# Patient Record
Sex: Male | Born: 1940 | Race: White | Hispanic: No | Marital: Married
Health system: Southern US, Community
[De-identification: ages and names within clinical notes are randomized; demographics above are authoritative.]

## PROBLEM LIST (undated history)

## (undated) DIAGNOSIS — J9621 Acute and chronic respiratory failure with hypoxia: Secondary | ICD-10-CM

## (undated) DIAGNOSIS — J1282 Pneumonia due to coronavirus disease 2019: Secondary | ICD-10-CM

## (undated) DIAGNOSIS — J95851 Ventilator associated pneumonia: Secondary | ICD-10-CM

## (undated) DIAGNOSIS — U071 COVID-19: Secondary | ICD-10-CM

## (undated) DIAGNOSIS — G9340 Encephalopathy, unspecified: Secondary | ICD-10-CM

---

## 2019-09-13 ENCOUNTER — Inpatient Hospital Stay
Admission: RE | Admit: 2019-09-13 | Discharge: 2019-10-05 | Disposition: A | Discharge status: Rehabilitation Facility | Payer: Medicare Other | Source: Other Acute Inpatient Hospital | Attending: Internal Medicine | Admitting: Internal Medicine

## 2019-09-13 DIAGNOSIS — J9690 Respiratory failure, unspecified, unspecified whether with hypoxia or hypercapnia: Secondary | ICD-10-CM

## 2019-09-13 DIAGNOSIS — J1282 Pneumonia due to coronavirus disease 2019: Secondary | ICD-10-CM | POA: Diagnosis present

## 2019-09-13 DIAGNOSIS — R0602 Shortness of breath: Secondary | ICD-10-CM

## 2019-09-13 DIAGNOSIS — J9621 Acute and chronic respiratory failure with hypoxia: Secondary | ICD-10-CM | POA: Diagnosis present

## 2019-09-13 DIAGNOSIS — U071 COVID-19: Secondary | ICD-10-CM | POA: Diagnosis present

## 2019-09-13 DIAGNOSIS — J95851 Ventilator associated pneumonia: Secondary | ICD-10-CM | POA: Diagnosis present

## 2019-09-13 DIAGNOSIS — G9340 Encephalopathy, unspecified: Secondary | ICD-10-CM | POA: Diagnosis present

## 2019-09-13 HISTORY — DX: Ventilator associated pneumonia: J95.851

## 2019-09-13 HISTORY — DX: Encephalopathy, unspecified: G93.40

## 2019-09-13 HISTORY — DX: Pneumonia due to coronavirus disease 2019: J12.82

## 2019-09-13 HISTORY — DX: Acute and chronic respiratory failure with hypoxia: J96.21

## 2019-09-13 HISTORY — DX: COVID-19: U07.1

## 2019-09-14 ENCOUNTER — Other Ambulatory Visit (HOSPITAL_COMMUNITY): Payer: Medicare Other

## 2019-09-14 DIAGNOSIS — J9621 Acute and chronic respiratory failure with hypoxia: Secondary | ICD-10-CM

## 2019-09-14 DIAGNOSIS — G9340 Encephalopathy, unspecified: Secondary | ICD-10-CM | POA: Diagnosis not present

## 2019-09-14 DIAGNOSIS — U071 COVID-19: Secondary | ICD-10-CM

## 2019-09-14 DIAGNOSIS — J95851 Ventilator associated pneumonia: Secondary | ICD-10-CM

## 2019-09-14 DIAGNOSIS — J1289 Other viral pneumonia: Secondary | ICD-10-CM

## 2019-09-14 LAB — COMPREHENSIVE METABOLIC PANEL
ALT: 85 U/L — ABNORMAL HIGH (ref 0–44)
AST: 38 U/L (ref 15–41)
Albumin: 3.1 g/dL — ABNORMAL LOW (ref 3.5–5.0)
Alkaline Phosphatase: 62 U/L (ref 38–126)
Anion gap: 13 (ref 5–15)
BUN: 19 mg/dL (ref 8–23)
CO2: 24 mmol/L (ref 22–32)
Calcium: 8.5 mg/dL — ABNORMAL LOW (ref 8.9–10.3)
Chloride: 106 mmol/L (ref 98–111)
Creatinine, Ser: 0.76 mg/dL (ref 0.61–1.24)
GFR calc Af Amer: >60 mL/min (ref >60)
GFR calc non Af Amer: >60 mL/min (ref >60)
Glucose, Bld: 94 mg/dL (ref 70–99)
Potassium: 3.9 mmol/L (ref 3.5–5.1)
Sodium: 143 mmol/L (ref 135–145)
Total Bilirubin: 0.9 mg/dL (ref 0.3–1.2)
Total Protein: 5.9 g/dL — ABNORMAL LOW (ref 6.5–8.1)

## 2019-09-14 LAB — CBC WITH DIFFERENTIAL/PLATELET
Abs Immature Granulocytes: 0.1 10*3/uL — ABNORMAL HIGH (ref 0.00–0.07)
Basophils Absolute: 0 10*3/uL (ref 0.0–0.1)
Basophils Relative: 0 %
Eosinophils Absolute: 0 10*3/uL (ref 0.0–0.5)
Eosinophils Relative: 1 %
HCT: 36.6 % — ABNORMAL LOW (ref 39.0–52.0)
Hemoglobin: 11.1 g/dL — ABNORMAL LOW (ref 13.0–17.0)
Immature Granulocytes: 1 %
Lymphocytes Relative: 14 %
Lymphs Abs: 1.2 10*3/uL (ref 0.7–4.0)
MCH: 29.8 pg (ref 26.0–34.0)
MCHC: 30.3 g/dL (ref 30.0–36.0)
MCV: 98.1 fL (ref 80.0–100.0)
Monocytes Absolute: 0.8 10*3/uL (ref 0.1–1.0)
Monocytes Relative: 9 %
Neutro Abs: 6.6 10*3/uL (ref 1.7–7.7)
Neutrophils Relative %: 75 %
Platelets: 233 10*3/uL (ref 150–400)
RBC: 3.73 MIL/uL — ABNORMAL LOW (ref 4.22–5.81)
RDW: 19.5 % — ABNORMAL HIGH (ref 11.5–15.5)
WBC: 8.7 10*3/uL (ref 4.0–10.5)
nRBC: 0 % (ref 0.0–0.2)

## 2019-09-14 LAB — PROTIME-INR
INR: 1 (ref 0.8–1.2)
Prothrombin Time: 13.2 seconds (ref 11.4–15.2)

## 2019-09-14 NOTE — Consult Note (Signed)
Pulmonary [place]  PULMONARY SERVICE  Date of Service: 09/14/2019  PULMONARY CRITICAL CARE CONSULT   NOLAND PIZANO  SPQ:[id number]  DOB: 1941/04/29   DOA: 09/13/2019  Referring Physician: Merton Border, MD  HPI: Jeremy Scott is a 78 y.o. male seen for follow up of Acute on Chronic Respiratory Failure.  Patient has multiple medical problems including hypertension hyperlipidemia myocardial infarction coronary artery disease GI bleed GERD sleep apnea presented to the hospital because of increasing shortness of breath and cough.  Patient was tested for COVID-19 and was found to be positive.  Patient had decompensation with increased oxygen requirements requiring 15 L nonrebreather and then was worsened eventually ending up intubated on the ventilator.  Treatment included convalescent plasma as well as new remdesivir.  Subsequent to the intubation patient was attempted at weaning did not tolerate weaning patient ended up having to have a tracheostomy done.  Patient now presents to our facility for further management and weaning.  Review of Systems:  ROS performed and is unremarkable other than noted above.  Past medical history: Hypertension Hyperlipidemia Coronary artery disease GI bleed GERD Sleep apnea Dermatitis  Past surgical history: CABG Colonoscopy Coronary stenting EGD Hernia repair  Social history: No current alcohol or drug abuse Former smoker  Family history: Positive for cancer diabetes   Medications: Reviewed on Rounds  Physical Exam:  Vitals: Temperature 98.4 pulse 88 respiratory 24 blood pressure 128/65 saturations 97%  Ventilator Settings off the ventilator on the nag device  . General: Comfortable at this time . Eyes: Grossly normal lids, irises & conjunctiva . ENT: grossly tongue is normal . Neck: no obvious mass . Cardiovascular: S1-S2 normal no gallop or rub . Respiratory: No rhonchi no rales are  noted . Abdomen: Soft nontender . Skin: no rash seen on limited exam . Musculoskeletal: not rigid . Psychiatric:unable to assess . Neurologic: no seizure no involuntary movements         Labs on Admission:  Basic Metabolic Panel: Recent Labs  Lab 09/14/19 0442  NA 143  K 3.9  CL 106  CO2 24  GLUCOSE 94  BUN 19  CREATININE 0.76  CALCIUM 8.5*    No results for input(s): PHART, PCO2ART, PO2ART, HCO3, O2SAT in the last 168 hours.  Liver Function Tests: Recent Labs  Lab 09/14/19 0442  AST 38  ALT 85*  ALKPHOS 62  BILITOT 0.9  PROT 5.9*  ALBUMIN 3.1*   No results for input(s): LIPASE, AMYLASE in the last 168 hours. No results for input(s): AMMONIA in the last 168 hours.  CBC: Recent Labs  Lab 09/14/19 0442  WBC 8.7  NEUTROABS 6.6  HGB 11.1*  HCT 36.6*  MCV 98.1  PLT 233    Cardiac Enzymes: No results for input(s): CKTOTAL, CKMB, CKMBINDEX, TROPONINI in the last 168 hours.  BNP (last 3 results) No results for input(s): BNP in the last 8760 hours.  ProBNP (last 3 results) No results for input(s): PROBNP in the last 8760 hours.   Radiological Exams on Admission: Dg Chest Port 1 View  Result Date: 09/14/2019 CLINICAL DATA:  History of COVID-19. EXAM: PORTABLE CHEST 1 VIEW COMPARISON:  No prior. FINDINGS: Prior median sternotomy. Cardiomegaly. No pulmonary venous congestion. Diffuse bilateral pulmonary interstitial prominence. Pneumonitis could present this fashion. No pleural effusion or pneumothorax. Surgical clips left upper abdomen. No acute bony abnormality. IMPRESSION: 1. Diffuse bilateral pulmonary interstitial prominence. Pneumonitis could present in this fashion. 2. Prior median sternotomy. Cardiomegaly.  No pulmonary venous congestion. Electronically Signed   By: Maisie Fus  Register   On: 09/14/2019 06:59    Assessment/Plan Active Problems:   Acute on chronic respiratory failure with hypoxia (HCC)   Pneumonia due to COVID-19 virus   COVID-19 virus  infection   Ventilator associated pneumonia (HCC)   Acute encephalopathy   1. Acute on chronic respiratory failure with hypoxia we will continue with advancing well and change the trach out to a cuffless trach you begin with capping trials. 2. COVID-19 pneumonia in recovery phase we will continue with supportive care last chest x-ray still showing interstitial prominence 3. COVID-19 virus infection in resolution phase we will continue with supportive care 4. Ventilator associated pneumonia patient was treated and showed some residual interstitial infiltrate 5. Encephalopathy unchanged we will continue to monitor closely.  I have personally seen and evaluated the patient, evaluated laboratory and imaging results, formulated the assessment and plan and placed orders. The Patient requires high complexity decision making for assessment and support.  Case was discussed on Rounds with the Respiratory Therapy Staff Time Spent  Yevonne Pax, MD [facility] Pulmonary Critical Care Medicine Sleep Medicine

## 2019-09-15 DIAGNOSIS — G9340 Encephalopathy, unspecified: Secondary | ICD-10-CM | POA: Diagnosis not present

## 2019-09-15 DIAGNOSIS — J95851 Ventilator associated pneumonia: Secondary | ICD-10-CM | POA: Diagnosis not present

## 2019-09-15 DIAGNOSIS — U071 COVID-19: Secondary | ICD-10-CM | POA: Diagnosis not present

## 2019-09-15 DIAGNOSIS — J9621 Acute and chronic respiratory failure with hypoxia: Secondary | ICD-10-CM | POA: Diagnosis not present

## 2019-09-15 NOTE — Progress Notes (Signed)
Pulmonary Critical Care Medicine [place]   PULMONARY CRITICAL CARE SERVICE  PROGRESS NOTE  Date of Service: 09/15/2019  Jeremy Scott  [id number]  DOB: 04-05-1941   DOA: 09/13/2019  Referring Physician: Merton Border, MD  HPI: Jeremy Scott is a 78 y.o. male seen for follow up of Acute on Chronic Respiratory Failure.  Patient is currently on 4 L capping and doing well  Medications: Reviewed on Rounds  Physical Exam:  Vitals: Temperature 97.8 pulse 72 respiratory 21 blood pressure 118/68 saturations 97%  Ventilator Settings capping off the ventilator  . General: Comfortable at this time . Eyes: Grossly normal lids, irises & conjunctiva . ENT: grossly tongue is normal . Neck: no obvious mass . Cardiovascular: S1 S2 normal no gallop . Respiratory: No rhonchi no rales are noted at this time . Abdomen: soft . Skin: no rash seen on limited exam . Musculoskeletal: not rigid . Psychiatric:unable to assess . Neurologic: no seizure no involuntary movements         Lab Data:   Basic Metabolic Panel: Recent Labs  Lab 09/14/19 0442  NA 143  K 3.9  CL 106  CO2 24  GLUCOSE 94  BUN 19  CREATININE 0.76  CALCIUM 8.5*    ABG: No results for input(s): PHART, PCO2ART, PO2ART, HCO3, O2SAT in the last 168 hours.  Liver Function Tests: Recent Labs  Lab 09/14/19 0442  AST 38  ALT 85*  ALKPHOS 62  BILITOT 0.9  PROT 5.9*  ALBUMIN 3.1*   No results for input(s): LIPASE, AMYLASE in the last 168 hours. No results for input(s): AMMONIA in the last 168 hours.  CBC: Recent Labs  Lab 09/14/19 0442  WBC 8.7  NEUTROABS 6.6  HGB 11.1*  HCT 36.6*  MCV 98.1  PLT 233    Cardiac Enzymes: No results for input(s): CKTOTAL, CKMB, CKMBINDEX, TROPONINI in the last 168 hours.  BNP (last 3 results) No results for input(s): BNP in the last 8760 hours.  ProBNP (last 3 results) No results for input(s): PROBNP in the last 8760 hours.  Radiological  Exams: Dg Chest Port 1 View  Result Date: 09/14/2019 CLINICAL DATA:  History of COVID-19. EXAM: PORTABLE CHEST 1 VIEW COMPARISON:  No prior. FINDINGS: Prior median sternotomy. Cardiomegaly. No pulmonary venous congestion. Diffuse bilateral pulmonary interstitial prominence. Pneumonitis could present this fashion. No pleural effusion or pneumothorax. Surgical clips left upper abdomen. No acute bony abnormality. IMPRESSION: 1. Diffuse bilateral pulmonary interstitial prominence. Pneumonitis could present in this fashion. 2. Prior median sternotomy. Cardiomegaly. No pulmonary venous congestion. Electronically Signed   By: Marcello Moores  Register   On: 09/14/2019 06:59    Assessment/Plan Active Problems:   Acute on chronic respiratory failure with hypoxia (HCC)   Pneumonia due to COVID-19 virus   COVID-19 virus infection   Ventilator associated pneumonia ([place])   Acute encephalopathy   1. Acute on chronic respiratory failure with hypoxia patient is going to continue with capping trials on 4 L oxygen doing well will advance as tolerated 2. Pneumonia due to COVID-19 still with trace we will need to continue to follow closely 3. COVID-19 virus infection in resolution phase 4. Ventilator associated pneumonia treated improved 5. Encephalopathy continues to show improvement   I have personally seen and evaluated the patient, evaluated laboratory and imaging results, formulated the assessment and plan and placed orders. The Patient requires high complexity decision making for assessment and support.  Case was discussed on Rounds with the Respiratory Therapy Staff  Allyne Gee, MD [facility] Pulmonary Critical Care Medicine Sleep Medicine

## 2019-09-16 ENCOUNTER — Encounter: Payer: Self-pay | Admitting: Internal Medicine

## 2019-09-16 DIAGNOSIS — J9621 Acute and chronic respiratory failure with hypoxia: Secondary | ICD-10-CM | POA: Diagnosis not present

## 2019-09-16 DIAGNOSIS — J95851 Ventilator associated pneumonia: Secondary | ICD-10-CM | POA: Diagnosis present

## 2019-09-16 DIAGNOSIS — U071 COVID-19: Secondary | ICD-10-CM | POA: Diagnosis present

## 2019-09-16 DIAGNOSIS — J1282 Pneumonia due to coronavirus disease 2019: Secondary | ICD-10-CM | POA: Diagnosis present

## 2019-09-16 DIAGNOSIS — G9340 Encephalopathy, unspecified: Secondary | ICD-10-CM | POA: Diagnosis not present

## 2019-09-16 NOTE — Progress Notes (Signed)
Pulmonary Critical Care Medicine [place]   PULMONARY CRITICAL CARE SERVICE  PROGRESS NOTE  Date of Service: 09/16/2019  Jeremy Scott  [id number]  DOB: 1941/10/10   DOA: 09/13/2019  Referring Physician: Merton Border, MD  HPI: Jeremy Scott is a 78 y.o. male seen for follow up of Acute on Chronic Respiratory Failure.  Patient has been capping now for 48 hours plan is to decannulate in the morning  Medications: Reviewed on Rounds  Physical Exam:  Vitals: Temperature is 100.7 pulse 89 respiratory 25 blood pressure 110/65 saturations 97%  Ventilator Settings capping right now off the ventilator  . General: Comfortable at this time . Eyes: Grossly normal lids, irises & conjunctiva . ENT: grossly tongue is normal . Neck: no obvious mass . Cardiovascular: S1 S2 normal no gallop . Respiratory: No rhonchi no rales are noted at this time . Abdomen: soft . Skin: no rash seen on limited exam . Musculoskeletal: not rigid . Psychiatric:unable to assess . Neurologic: no seizure no involuntary movements         Lab Data:   Basic Metabolic Panel: Recent Labs  Lab 09/14/19 0442  NA 143  K 3.9  CL 106  CO2 24  GLUCOSE 94  BUN 19  CREATININE 0.76  CALCIUM 8.5*    ABG: No results for input(s): PHART, PCO2ART, PO2ART, HCO3, O2SAT in the last 168 hours.  Liver Function Tests: Recent Labs  Lab 09/14/19 0442  AST 38  ALT 85*  ALKPHOS 62  BILITOT 0.9  PROT 5.9*  ALBUMIN 3.1*   No results for input(s): LIPASE, AMYLASE in the last 168 hours. No results for input(s): AMMONIA in the last 168 hours.  CBC: Recent Labs  Lab 09/14/19 0442  WBC 8.7  NEUTROABS 6.6  HGB 11.1*  HCT 36.6*  MCV 98.1  PLT 233    Cardiac Enzymes: No results for input(s): CKTOTAL, CKMB, CKMBINDEX, TROPONINI in the last 168 hours.  BNP (last 3 results) No results for input(s): BNP in the last 8760 hours.  ProBNP (last 3 results) No results for input(s):  PROBNP in the last 8760 hours.  Radiological Exams: No results found.  Assessment/Plan Active Problems:   Acute on chronic respiratory failure with hypoxia (HCC)   Pneumonia due to COVID-19 virus   COVID-19 virus infection   Ventilator associated pneumonia ([place])   Acute encephalopathy   1. Acute on chronic respiratory failure hypoxia clinically improving completed 48 hours of capping plan is to continue forward to 72 hours 2. Pneumonia due to COVID-19 treated clinically improved 3. COVID-19 virus infection in resolution phase 4. Ventilator associated pneumonia. 5. Encephalopathy clinically improved   I have personally seen and evaluated the patient, evaluated laboratory and imaging results, formulated the assessment and plan and placed orders. The Patient requires high complexity decision making for assessment and support.  Case was discussed on Rounds with the Respiratory Therapy Staff  Allyne Gee, MD [facility] Pulmonary Critical Care Medicine Sleep Medicine

## 2019-09-17 DIAGNOSIS — U071 COVID-19: Secondary | ICD-10-CM | POA: Diagnosis not present

## 2019-09-17 DIAGNOSIS — G9340 Encephalopathy, unspecified: Secondary | ICD-10-CM | POA: Diagnosis not present

## 2019-09-17 DIAGNOSIS — J95851 Ventilator associated pneumonia: Secondary | ICD-10-CM | POA: Diagnosis not present

## 2019-09-17 DIAGNOSIS — J9621 Acute and chronic respiratory failure with hypoxia: Secondary | ICD-10-CM | POA: Diagnosis not present

## 2019-09-17 NOTE — Progress Notes (Addendum)
Pulmonary Critical Care Medicine [place]   PULMONARY CRITICAL CARE SERVICE  PROGRESS NOTE  Date of Service: 09/17/2019  Jeremy Scott  [id number]:[id number]  DOB: 1941/10/14   DOA: 09/13/2019  Referring Physician: Merton Border, MD  HPI: Jeremy Scott is a 78 y.o. male seen for follow up of Acute on Chronic Respiratory Failure.  Patient was decannulated today on 2 L of oxygen by nasal cannula satting well with no distress.  Medications: Reviewed on Rounds  Physical Exam:  Vitals: Pulse 77 respirations 21 BP 100/53 O2 sat 97% temp 98.4  Ventilator Settings 2 L nasal cannula  . General: Comfortable at this time . Eyes: Grossly normal lids, irises & conjunctiva . ENT: grossly tongue is normal . Neck: no obvious mass . Cardiovascular: S1 S2 normal no gallop . Respiratory: No rales or rhonchi noted . Abdomen: soft . Skin: no rash seen on limited exam . Musculoskeletal: not rigid . Psychiatric:unable to assess . Neurologic: no seizure no involuntary movements         Lab Data:   Basic Metabolic Panel: Recent Labs  Lab 09/14/19 0442  NA 143  K 3.9  CL 106  CO2 24  GLUCOSE 94  BUN 19  CREATININE 0.76  CALCIUM 8.5*    ABG: No results for input(s): PHART, PCO2ART, PO2ART, HCO3, O2SAT in the last 168 hours.  Liver Function Tests: Recent Labs  Lab 09/14/19 0442  AST 38  ALT 85*  ALKPHOS 62  BILITOT 0.9  PROT 5.9*  ALBUMIN 3.1*   No results for input(s): LIPASE, AMYLASE in the last 168 hours. No results for input(s): AMMONIA in the last 168 hours.  CBC: Recent Labs  Lab 09/14/19 0442  WBC 8.7  NEUTROABS 6.6  HGB 11.1*  HCT 36.6*  MCV 98.1  PLT 233    Cardiac Enzymes: No results for input(s): CKTOTAL, CKMB, CKMBINDEX, TROPONINI in the last 168 hours.  BNP (last 3 results) No results for input(s): BNP in the last 8760 hours.  ProBNP (last 3 results) No results for input(s): PROBNP in the last 8760 hours.  Radiological  Exams: No results found.  Assessment/Plan Active Problems:   Acute on chronic respiratory failure with hypoxia (HCC)   Pneumonia due to COVID-19 virus   COVID-19 virus infection   Ventilator associated pneumonia ([place])   Acute encephalopathy   1. Acute on chronic respiratory failure hypoxia patient decannulated on 2 L oxygen satting well continue aggressive pulmonary toilet supportive measures. 2. Pneumonia due to COVID-19 treated clinically improved 3. COVID-19 virus infection in resolution phase 4. Ventilator associated pneumonia. 5. Encephalopathy clinically improved   I have personally seen and evaluated the patient, evaluated laboratory and imaging results, formulated the assessment and plan and placed orders. The Patient requires high complexity decision making for assessment and support.  Case was discussed on Rounds with the Respiratory Therapy Staff  Allyne Gee, MD [facility] Pulmonary Critical Care Medicine Sleep Medicine

## 2019-09-18 DIAGNOSIS — U071 COVID-19: Secondary | ICD-10-CM | POA: Diagnosis not present

## 2019-09-18 DIAGNOSIS — J95851 Ventilator associated pneumonia: Secondary | ICD-10-CM | POA: Diagnosis not present

## 2019-09-18 DIAGNOSIS — G9340 Encephalopathy, unspecified: Secondary | ICD-10-CM | POA: Diagnosis not present

## 2019-09-18 DIAGNOSIS — J9621 Acute and chronic respiratory failure with hypoxia: Secondary | ICD-10-CM | POA: Diagnosis not present

## 2019-09-18 NOTE — Progress Notes (Addendum)
Pulmonary Critical Care Medicine [place]   PULMONARY CRITICAL CARE SERVICE  PROGRESS NOTE  Date of Service: 09/18/2019  JAMARIOUS FEBO  [id number]:[id number]  DOB: Mar 16, 1941   DOA: 09/13/2019  Referring Physician: Merton Border, MD  HPI: Jeremy Scott is a 78 y.o. male seen for follow up of Acute on Chronic Respiratory Failure.  Patient remains decannulated on 2 L of oxygen via cannula or BiPAP overnight satting well no distress.  Medications: Reviewed on Rounds  Physical Exam:  Vitals: Pulse 73 respirations 24 BP 97/62 O2 sat 95% temp 97.1  Ventilator Settings 2 L nasal cannula  . General: Comfortable at this time . Eyes: Grossly normal lids, irises & conjunctiva . ENT: grossly tongue is normal . Neck: no obvious mass . Cardiovascular: S1 S2 normal no gallop . Respiratory: No rales or rhonchi noted . Abdomen: soft . Skin: no rash seen on limited exam . Musculoskeletal: not rigid . Psychiatric:unable to assess . Neurologic: no seizure no involuntary movements         Lab Data:   Basic Metabolic Panel: Recent Labs  Lab 09/14/19 0442  NA 143  K 3.9  CL 106  CO2 24  GLUCOSE 94  BUN 19  CREATININE 0.76  CALCIUM 8.5*    ABG: No results for input(s): PHART, PCO2ART, PO2ART, HCO3, O2SAT in the last 168 hours.  Liver Function Tests: Recent Labs  Lab 09/14/19 0442  AST 38  ALT 85*  ALKPHOS 62  BILITOT 0.9  PROT 5.9*  ALBUMIN 3.1*   No results for input(s): LIPASE, AMYLASE in the last 168 hours. No results for input(s): AMMONIA in the last 168 hours.  CBC: Recent Labs  Lab 09/14/19 0442  WBC 8.7  NEUTROABS 6.6  HGB 11.1*  HCT 36.6*  MCV 98.1  PLT 233    Cardiac Enzymes: No results for input(s): CKTOTAL, CKMB, CKMBINDEX, TROPONINI in the last 168 hours.  BNP (last 3 results) No results for input(s): BNP in the last 8760 hours.  ProBNP (last 3 results) No results for input(s): PROBNP in the last 8760 hours.  Radiological  Exams: No results found.  Assessment/Plan Active Problems:   Acute on chronic respiratory failure with hypoxia (HCC)   Pneumonia due to COVID-19 virus   COVID-19 virus infection   Ventilator associated pneumonia ([place])   Acute encephalopathy   1. Acute on chronic respiratory failure hypoxia patient continues to do well decannulated were BiPAP overnight and is on 2 L of oxygen nasal cannula during the day.  Continue supportive measures and pulmonary toilet. 2. Pneumonia due to COVID-19 treated clinically improved 3. COVID-19 virus infection in resolution phase 4. Ventilator associated pneumonia. 5. Encephalopathy clinically improved   I have personally seen and evaluated the patient, evaluated laboratory and imaging results, formulated the assessment and plan and placed orders. The Patient requires high complexity decision making for assessment and support.  Case was discussed on Rounds with the Respiratory Therapy Staff  Allyne Gee, MD [facility] Pulmonary Critical Care Medicine Sleep Medicine

## 2019-09-19 DIAGNOSIS — G9340 Encephalopathy, unspecified: Secondary | ICD-10-CM | POA: Diagnosis not present

## 2019-09-19 DIAGNOSIS — U071 COVID-19: Secondary | ICD-10-CM | POA: Diagnosis not present

## 2019-09-19 DIAGNOSIS — J9621 Acute and chronic respiratory failure with hypoxia: Secondary | ICD-10-CM | POA: Diagnosis not present

## 2019-09-19 DIAGNOSIS — J95851 Ventilator associated pneumonia: Secondary | ICD-10-CM | POA: Diagnosis not present

## 2019-09-19 LAB — BASIC METABOLIC PANEL
Anion gap: 10 (ref 5–15)
BUN: 34 mg/dL — ABNORMAL HIGH (ref 8–23)
CO2: 21 mmol/L — ABNORMAL LOW (ref 22–32)
Calcium: 8.1 mg/dL — ABNORMAL LOW (ref 8.9–10.3)
Chloride: 109 mmol/L (ref 98–111)
Creatinine, Ser: 0.85 mg/dL (ref 0.61–1.24)
GFR calc Af Amer: >60 mL/min (ref >60)
GFR calc non Af Amer: >60 mL/min (ref >60)
Glucose, Bld: 102 mg/dL — ABNORMAL HIGH (ref 70–99)
Potassium: 3.8 mmol/L (ref 3.5–5.1)
Sodium: 140 mmol/L (ref 135–145)

## 2019-09-19 LAB — CBC
HCT: 32.9 % — ABNORMAL LOW (ref 39.0–52.0)
Hemoglobin: 10 g/dL — ABNORMAL LOW (ref 13.0–17.0)
MCH: 29.3 pg (ref 26.0–34.0)
MCHC: 30.4 g/dL (ref 30.0–36.0)
MCV: 96.5 fL (ref 80.0–100.0)
Platelets: 133 10*3/uL — ABNORMAL LOW (ref 150–400)
RBC: 3.41 MIL/uL — ABNORMAL LOW (ref 4.22–5.81)
RDW: 19.4 % — ABNORMAL HIGH (ref 11.5–15.5)
WBC: 7.5 10*3/uL (ref 4.0–10.5)
nRBC: 0 % (ref 0.0–0.2)

## 2019-09-19 LAB — MAGNESIUM: Magnesium: 2 mg/dL (ref 1.7–2.4)

## 2019-09-19 NOTE — Progress Notes (Addendum)
Pulmonary Critical Care Medicine [place]   PULMONARY CRITICAL CARE SERVICE  PROGRESS NOTE  Date of Service: 09/19/2019  Jeremy Scott  [id number]  DOB: April 19, 1941   DOA: 09/13/2019  Referring Physician: Merton Border, MD  HPI: Jeremy Scott is a 78 y.o. male seen for follow up of Acute on Chronic Respiratory Failure.  Patient remains decannulated on 2 L of oxygen.  Satting well no fever distress.  Medications: Reviewed on Rounds  Physical Exam:  Vitals: Pulse 64 respirations 22 BP 106/57 O2 sat 97% temp 97.4  Ventilator Settings 2 L nasal cannula  . General: Comfortable at this time . Eyes: Grossly normal lids, irises & conjunctiva . ENT: grossly tongue is normal . Neck: no obvious mass . Cardiovascular: S1 S2 normal no gallop . Respiratory: No rales or rhonchi noted . Abdomen: soft . Skin: no rash seen on limited exam . Musculoskeletal: not rigid . Psychiatric:unable to assess . Neurologic: no seizure no involuntary movements         Lab Data:   Basic Metabolic Panel: Recent Labs  Lab 09/14/19 0442 09/19/19 0449  NA 143 140  K 3.9 3.8  CL 106 109  CO2 24 21*  GLUCOSE 94 102*  BUN 19 34*  CREATININE 0.76 0.85  CALCIUM 8.5* 8.1*  MG  --  2.0    ABG: No results for input(s): PHART, PCO2ART, PO2ART, HCO3, O2SAT in the last 168 hours.  Liver Function Tests: Recent Labs  Lab 09/14/19 0442  AST 38  ALT 85*  ALKPHOS 62  BILITOT 0.9  PROT 5.9*  ALBUMIN 3.1*   No results for input(s): LIPASE, AMYLASE in the last 168 hours. No results for input(s): AMMONIA in the last 168 hours.  CBC: Recent Labs  Lab 09/14/19 0442 09/19/19 0449  WBC 8.7 7.5  NEUTROABS 6.6  --   HGB 11.1* 10.0*  HCT 36.6* 32.9*  MCV 98.1 96.5  PLT 233 133*    Cardiac Enzymes: No results for input(s): CKTOTAL, CKMB, CKMBINDEX, TROPONINI in the last 168 hours.  BNP (last 3 results) No results for input(s): BNP in the last 8760 hours.  ProBNP  (last 3 results) No results for input(s): PROBNP in the last 8760 hours.  Radiological Exams: No results found.  Assessment/Plan Active Problems:   Acute on chronic respiratory failure with hypoxia (HCC)   Pneumonia due to COVID-19 virus   COVID-19 virus infection   Ventilator associated pneumonia ([place])   Acute encephalopathy    1. Acute on chronic respiratory failure hypoxia  patient is doing well decannulation.  Can use BiPAP at night as needed.  We will continue 2 L cannula. 2. Pneumonia due to COVID-19 treated clinically improved 3. COVID-19 virus infection in resolution phase 4. Ventilator associated pneumonia. 5. Encephalopathy clinically improved   I have personally seen and evaluated the patient, evaluated laboratory and imaging results, formulated the assessment and plan and placed orders. The Patient requires high complexity decision making for assessment and support.  Case was discussed on Rounds with the Respiratory Therapy Staff  Allyne Gee, MD [facility] Pulmonary Critical Care Medicine Sleep Medicine

## 2019-09-20 DIAGNOSIS — G9340 Encephalopathy, unspecified: Secondary | ICD-10-CM | POA: Diagnosis not present

## 2019-09-20 DIAGNOSIS — U071 COVID-19: Secondary | ICD-10-CM | POA: Diagnosis not present

## 2019-09-20 DIAGNOSIS — J9621 Acute and chronic respiratory failure with hypoxia: Secondary | ICD-10-CM | POA: Diagnosis not present

## 2019-09-20 DIAGNOSIS — J95851 Ventilator associated pneumonia: Secondary | ICD-10-CM | POA: Diagnosis not present

## 2019-09-20 NOTE — Progress Notes (Addendum)
Pulmonary Critical Care Medicine [place]   PULMONARY CRITICAL CARE SERVICE  PROGRESS NOTE  Date of Service: 09/20/2019  PHILOPATEER STRINE  [id number]:[id number]  DOB: 24-Oct-1941   DOA: 09/13/2019  Referring Physician: Merton Border, MD  HPI: Jeremy Scott is a 78 y.o. male seen for follow up of Acute on Chronic Respiratory Failure.  Patient continues on 2 L oxygen via nasal cannula and uses BiPAP as needed.  Satting well no distress.  Medications: Reviewed on Rounds  Physical Exam:  Vitals: Pulse 69 respirations 24 BP 112/63 O2 sat 98% temp 98.3  Ventilator Settings 2 L nasal cannula  . General: Comfortable at this time . Eyes: Grossly normal lids, irises & conjunctiva . ENT: grossly tongue is normal . Neck: no obvious mass . Cardiovascular: S1 S2 normal no gallop . Respiratory: No rales or rhonchi noted . Abdomen: soft . Skin: no rash seen on limited exam . Musculoskeletal: not rigid . Psychiatric:unable to assess . Neurologic: no seizure no involuntary movements         Lab Data:   Basic Metabolic Panel: Recent Labs  Lab 09/14/19 0442 09/19/19 0449  NA 143 140  K 3.9 3.8  CL 106 109  CO2 24 21*  GLUCOSE 94 102*  BUN 19 34*  CREATININE 0.76 0.85  CALCIUM 8.5* 8.1*  MG  --  2.0    ABG: No results for input(s): PHART, PCO2ART, PO2ART, HCO3, O2SAT in the last 168 hours.  Liver Function Tests: Recent Labs  Lab 09/14/19 0442  AST 38  ALT 85*  ALKPHOS 62  BILITOT 0.9  PROT 5.9*  ALBUMIN 3.1*   No results for input(s): LIPASE, AMYLASE in the last 168 hours. No results for input(s): AMMONIA in the last 168 hours.  CBC: Recent Labs  Lab 09/14/19 0442 09/19/19 0449  WBC 8.7 7.5  NEUTROABS 6.6  --   HGB 11.1* 10.0*  HCT 36.6* 32.9*  MCV 98.1 96.5  PLT 233 133*    Cardiac Enzymes: No results for input(s): CKTOTAL, CKMB, CKMBINDEX, TROPONINI in the last 168 hours.  BNP (last 3 results) No results for input(s): BNP in the last  8760 hours.  ProBNP (last 3 results) No results for input(s): PROBNP in the last 8760 hours.  Radiological Exams: No results found.  Assessment/Plan Active Problems:   Acute on chronic respiratory failure with hypoxia (HCC)   Pneumonia due to COVID-19 virus   COVID-19 virus infection   Ventilator associated pneumonia ([place])   Acute encephalopathy   1. Acute on chronic respiratory failure hypoxia patient is doing well decannulation.  Can use BiPAP at night as needed.  We will continue 2 L cannula. 2. Pneumonia due to COVID-19 treated clinically improved 3. COVID-19 virus infection in resolution phase 4. Ventilator associated pneumonia. 5. Encephalopathy clinically improved   I have personally seen and evaluated the patient, evaluated laboratory and imaging results, formulated the assessment and plan and placed orders. The Patient requires high complexity decision making for assessment and support.  Case was discussed on Rounds with the Respiratory Therapy Staff  Allyne Gee, MD [facility] Pulmonary Critical Care Medicine Sleep Medicine

## 2019-09-21 ENCOUNTER — Other Ambulatory Visit (HOSPITAL_COMMUNITY): Payer: Medicare Other

## 2019-09-21 DIAGNOSIS — J95851 Ventilator associated pneumonia: Secondary | ICD-10-CM | POA: Diagnosis not present

## 2019-09-21 DIAGNOSIS — U071 COVID-19: Secondary | ICD-10-CM | POA: Diagnosis not present

## 2019-09-21 DIAGNOSIS — J9621 Acute and chronic respiratory failure with hypoxia: Secondary | ICD-10-CM | POA: Diagnosis not present

## 2019-09-21 DIAGNOSIS — G9340 Encephalopathy, unspecified: Secondary | ICD-10-CM | POA: Diagnosis not present

## 2019-09-21 LAB — BASIC METABOLIC PANEL
Anion gap: 7 (ref 5–15)
BUN: 15 mg/dL (ref 8–23)
CO2: 26 mmol/L (ref 22–32)
Calcium: 8.3 mg/dL — ABNORMAL LOW (ref 8.9–10.3)
Chloride: 108 mmol/L (ref 98–111)
Creatinine, Ser: 0.76 mg/dL (ref 0.61–1.24)
GFR calc Af Amer: >60 mL/min (ref >60)
GFR calc non Af Amer: >60 mL/min (ref >60)
Glucose, Bld: 108 mg/dL — ABNORMAL HIGH (ref 70–99)
Potassium: 3.8 mmol/L (ref 3.5–5.1)
Sodium: 141 mmol/L (ref 135–145)

## 2019-09-21 LAB — CBC
HCT: 33.1 % — ABNORMAL LOW (ref 39.0–52.0)
Hemoglobin: 10.1 g/dL — ABNORMAL LOW (ref 13.0–17.0)
MCH: 29.4 pg (ref 26.0–34.0)
MCHC: 30.5 g/dL (ref 30.0–36.0)
MCV: 96.5 fL (ref 80.0–100.0)
Platelets: 145 10*3/uL — ABNORMAL LOW (ref 150–400)
RBC: 3.43 MIL/uL — ABNORMAL LOW (ref 4.22–5.81)
RDW: 18.6 % — ABNORMAL HIGH (ref 11.5–15.5)
WBC: 5.3 10*3/uL (ref 4.0–10.5)
nRBC: 0 % (ref 0.0–0.2)

## 2019-09-21 LAB — PHOSPHORUS: Phosphorus: 3.6 mg/dL (ref 2.5–4.6)

## 2019-09-21 LAB — MAGNESIUM: Magnesium: 1.8 mg/dL (ref 1.7–2.4)

## 2019-09-21 NOTE — Progress Notes (Signed)
Pulmonary Critical Care Medicine [place]   PULMONARY CRITICAL CARE SERVICE  PROGRESS NOTE  Date of Service: 09/21/2019  Jeremy Scott  HFW:[id number]  DOB: 06/06/1941   DOA: 09/13/2019  Referring Physician: Merton Border, MD  HPI: Jeremy Scott is a 78 y.o. male seen for follow up of Acute on Chronic Respiratory Failure.  Patient currently is on 2 L decannulate  Medications: Reviewed on Rounds  Physical Exam:  Vitals: Temperature 97.2 pulse 72 respiratory 24 blood pressure 109/53 saturations 98%  Ventilator Settings off the ventilator on T collar  . General: Comfortable at this time . Eyes: Grossly normal lids, irises & conjunctiva . ENT: grossly tongue is normal . Neck: no obvious mass . Cardiovascular: S1 S2 normal no gallop . Respiratory: No rhonchi no rales are noted . Abdomen: soft . Skin: no rash seen on limited exam . Musculoskeletal: not rigid . Psychiatric:unable to assess . Neurologic: no seizure no involuntary movements         Lab Data:   Basic Metabolic Panel: Recent Labs  Lab 09/19/19 0449 09/21/19 0439  NA 140 141  K 3.8 3.8  CL 109 108  CO2 21* 26  GLUCOSE 102* 108*  BUN 34* 15  CREATININE 0.85 0.76  CALCIUM 8.1* 8.3*  MG 2.0 1.8  PHOS  --  3.6    ABG: No results for input(s): PHART, PCO2ART, PO2ART, HCO3, O2SAT in the last 168 hours.  Liver Function Tests: No results for input(s): AST, ALT, ALKPHOS, BILITOT, PROT, ALBUMIN in the last 168 hours. No results for input(s): LIPASE, AMYLASE in the last 168 hours. No results for input(s): AMMONIA in the last 168 hours.  CBC: Recent Labs  Lab 09/19/19 0449 09/21/19 0439  WBC 7.5 5.3  HGB 10.0* 10.1*  HCT 32.9* 33.1*  MCV 96.5 96.5  PLT 133* 145*    Cardiac Enzymes: No results for input(s): CKTOTAL, CKMB, CKMBINDEX, TROPONINI in the last 168 hours.  BNP (last 3 results) No results for input(s): BNP in the last 8760 hours.  ProBNP (last 3 results) No  results for input(s): PROBNP in the last 8760 hours.  Radiological Exams: No results found.  Assessment/Plan Active Problems:   Acute on chronic respiratory failure with hypoxia (HCC)   Pneumonia due to COVID-19 virus   COVID-19 virus infection   Ventilator associated pneumonia ([place])   Acute encephalopathy   1. Acute on chronic respiratory failure hypoxia patient continues on 2 L oxygen doing well titrate down as tolerated 2. Pneumonia due to COVID-19 resolved 3. COVID-19 virus infection resolution phase 4. Ventricular associated pneumonia resolved 5. Acute encephalopathy improving   I have personally seen and evaluated the patient, evaluated laboratory and imaging results, formulated the assessment and plan and placed orders. The Patient requires high complexity decision making for assessment and support.  Case was discussed on Rounds with the Respiratory Therapy Staff  Allyne Gee, MD [facility] Pulmonary Critical Care Medicine Sleep Medicine

## 2019-09-22 ENCOUNTER — Other Ambulatory Visit (HOSPITAL_COMMUNITY): Payer: Medicare Other

## 2019-09-23 LAB — BASIC METABOLIC PANEL
Anion gap: 9 (ref 5–15)
BUN: 13 mg/dL (ref 8–23)
CO2: 26 mmol/L (ref 22–32)
Calcium: 8.4 mg/dL — ABNORMAL LOW (ref 8.9–10.3)
Chloride: 105 mmol/L (ref 98–111)
Creatinine, Ser: 0.82 mg/dL (ref 0.61–1.24)
GFR calc Af Amer: >60 mL/min (ref >60)
GFR calc non Af Amer: >60 mL/min (ref >60)
Glucose, Bld: 103 mg/dL — ABNORMAL HIGH (ref 70–99)
Potassium: 4.1 mmol/L (ref 3.5–5.1)
Sodium: 140 mmol/L (ref 135–145)

## 2019-09-23 LAB — MAGNESIUM: Magnesium: 1.6 mg/dL — ABNORMAL LOW (ref 1.7–2.4)

## 2019-09-23 LAB — CBC
HCT: 31.7 % — ABNORMAL LOW (ref 39.0–52.0)
Hemoglobin: 9.8 g/dL — ABNORMAL LOW (ref 13.0–17.0)
MCH: 29.5 pg (ref 26.0–34.0)
MCHC: 30.9 g/dL (ref 30.0–36.0)
MCV: 95.5 fL (ref 80.0–100.0)
Platelets: 178 10*3/uL (ref 150–400)
RBC: 3.32 MIL/uL — ABNORMAL LOW (ref 4.22–5.81)
RDW: 18.5 % — ABNORMAL HIGH (ref 11.5–15.5)
WBC: 6.2 10*3/uL (ref 4.0–10.5)
nRBC: 0 % (ref 0.0–0.2)

## 2019-09-23 LAB — PHOSPHORUS: Phosphorus: 3.6 mg/dL (ref 2.5–4.6)

## 2019-09-24 ENCOUNTER — Other Ambulatory Visit (HOSPITAL_COMMUNITY): Payer: Medicare Other

## 2019-09-24 LAB — MAGNESIUM: Magnesium: 1.9 mg/dL (ref 1.7–2.4)

## 2019-09-27 LAB — CBC
HCT: 33.1 % — ABNORMAL LOW (ref 39.0–52.0)
Hemoglobin: 10.4 g/dL — ABNORMAL LOW (ref 13.0–17.0)
MCH: 29.6 pg (ref 26.0–34.0)
MCHC: 31.4 g/dL (ref 30.0–36.0)
MCV: 94.3 fL (ref 80.0–100.0)
Platelets: 285 10*3/uL (ref 150–400)
RBC: 3.51 MIL/uL — ABNORMAL LOW (ref 4.22–5.81)
RDW: 18.3 % — ABNORMAL HIGH (ref 11.5–15.5)
WBC: 8.9 10*3/uL (ref 4.0–10.5)
nRBC: 0 % (ref 0.0–0.2)

## 2019-09-27 LAB — BASIC METABOLIC PANEL
Anion gap: 13 (ref 5–15)
BUN: 11 mg/dL (ref 8–23)
CO2: 28 mmol/L (ref 22–32)
Calcium: 9 mg/dL (ref 8.9–10.3)
Chloride: 98 mmol/L (ref 98–111)
Creatinine, Ser: 0.92 mg/dL (ref 0.61–1.24)
GFR calc Af Amer: >60 mL/min (ref >60)
GFR calc non Af Amer: >60 mL/min (ref >60)
Glucose, Bld: 119 mg/dL — ABNORMAL HIGH (ref 70–99)
Potassium: 4.5 mmol/L (ref 3.5–5.1)
Sodium: 139 mmol/L (ref 135–145)

## 2019-09-27 LAB — PHOSPHORUS: Phosphorus: 4.5 mg/dL (ref 2.5–4.6)

## 2019-09-27 LAB — MAGNESIUM: Magnesium: 1.7 mg/dL (ref 1.7–2.4)

## 2019-09-28 LAB — MAGNESIUM: Magnesium: 2 mg/dL (ref 1.7–2.4)

## 2019-10-01 LAB — NOVEL CORONAVIRUS, NAA (HOSP ORDER, SEND-OUT TO REF LAB; TAT 18-24 HRS): SARS-CoV-2, NAA: NOT DETECTED

## 2019-10-02 LAB — CBC
HCT: 34.7 % — ABNORMAL LOW (ref 39.0–52.0)
Hemoglobin: 10.7 g/dL — ABNORMAL LOW (ref 13.0–17.0)
MCH: 29.1 pg (ref 26.0–34.0)
MCHC: 30.8 g/dL (ref 30.0–36.0)
MCV: 94.3 fL (ref 80.0–100.0)
Platelets: 310 10*3/uL (ref 150–400)
RBC: 3.68 MIL/uL — ABNORMAL LOW (ref 4.22–5.81)
RDW: 18.1 % — ABNORMAL HIGH (ref 11.5–15.5)
WBC: 8.8 10*3/uL (ref 4.0–10.5)
nRBC: 0 % (ref 0.0–0.2)

## 2019-10-02 LAB — BASIC METABOLIC PANEL
Anion gap: 13 (ref 5–15)
BUN: 20 mg/dL (ref 8–23)
CO2: 27 mmol/L (ref 22–32)
Calcium: 9.3 mg/dL (ref 8.9–10.3)
Chloride: 99 mmol/L (ref 98–111)
Creatinine, Ser: 0.79 mg/dL (ref 0.61–1.24)
GFR calc Af Amer: >60 mL/min (ref >60)
GFR calc non Af Amer: >60 mL/min (ref >60)
Glucose, Bld: 106 mg/dL — ABNORMAL HIGH (ref 70–99)
Potassium: 4.3 mmol/L (ref 3.5–5.1)
Sodium: 139 mmol/L (ref 135–145)

## 2019-10-02 LAB — MAGNESIUM: Magnesium: 1.9 mg/dL (ref 1.7–2.4)

## 2020-02-18 IMAGING — DX DG CHEST 1V PORT
1 series · 1 of 1 positions shown · non-contrast
Comparison: 09/14/2019

CLINICAL DATA: Shortness of breath, recovering from SZCKF-XS a few
months ago.

EXAM:
PORTABLE CHEST 1 VIEW

[chest]
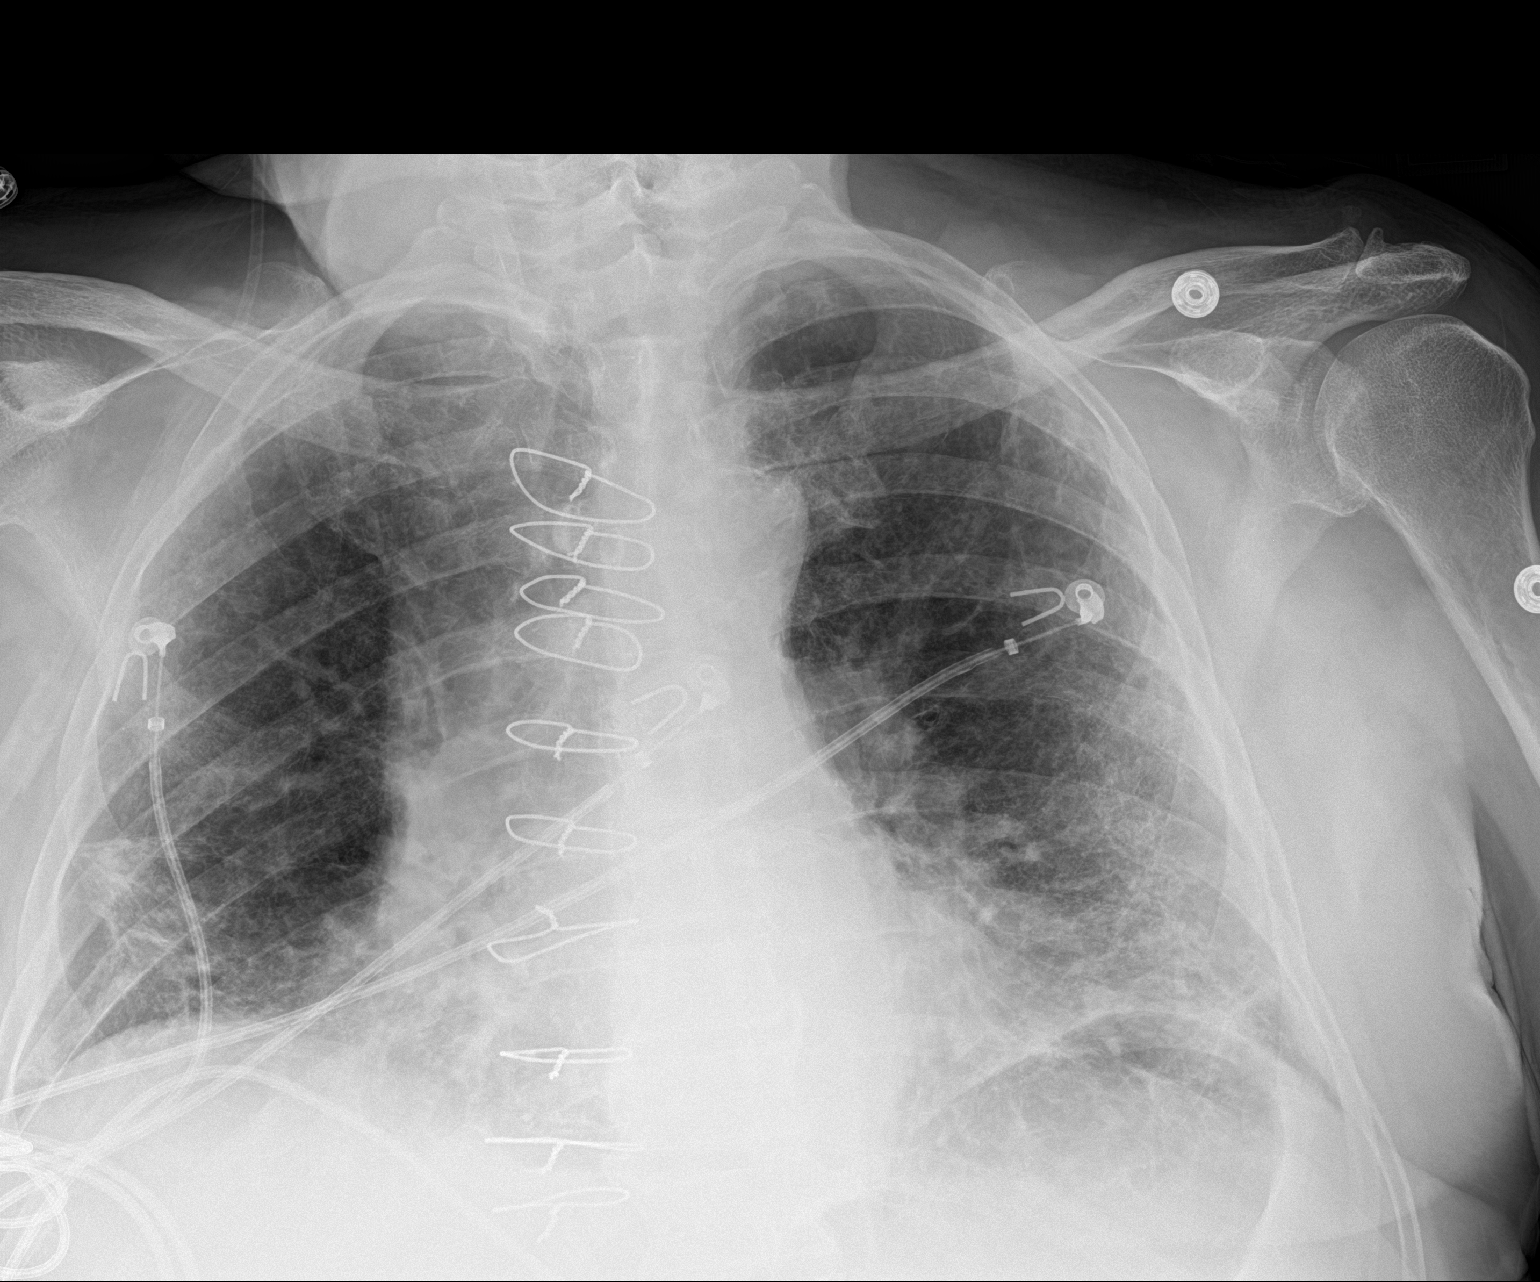

[1 of 1 positions shown; findings below may reference images not displayed]

FINDINGS: Patient is rotated to the right slightly. Sternotomy wires
unchanged. Lungs are adequately inflated with bilateral interstitial
prominence worse over the lung base and not significantly changed
from the previous exam. No new airspace process or effusion. Mild
stable cardiomegaly. Remainder the exam is unchanged.
IMPRESSION: No acute findings.

Stable interstitial disease.
